# Patient Record
Sex: Male | Born: 1995 | Race: White | Hispanic: Yes | Marital: Single | Smoking: Never smoker
Health system: Southern US, Community
[De-identification: ages and names within clinical notes are randomized; demographics above are authoritative.]

## PROBLEM LIST (undated history)

## (undated) DIAGNOSIS — F909 Attention-deficit hyperactivity disorder, unspecified type: Secondary | ICD-10-CM

## (undated) DIAGNOSIS — F319 Bipolar disorder, unspecified: Secondary | ICD-10-CM

## (undated) HISTORY — PX: ANKLE SURGERY: SHX546

## (undated) HISTORY — DX: Bipolar disorder, unspecified: F31.9

## (undated) HISTORY — PX: HAND SURGERY: SHX662

## (undated) HISTORY — DX: Attention-deficit hyperactivity disorder, unspecified type: F90.9

---

## 2021-05-21 ENCOUNTER — Other Ambulatory Visit: Payer: Self-pay

## 2021-05-21 ENCOUNTER — Emergency Department (HOSPITAL_COMMUNITY): Payer: Self-pay

## 2021-05-21 ENCOUNTER — Encounter (HOSPITAL_COMMUNITY): Payer: Self-pay

## 2021-05-21 ENCOUNTER — Emergency Department (HOSPITAL_COMMUNITY)
Admission: EM | Admit: 2021-05-21 | Discharge: 2021-05-21 | Disposition: A | Discharge status: Home / Self Care | Payer: Self-pay | Attending: Emergency Medicine | Admitting: Emergency Medicine

## 2021-05-21 DIAGNOSIS — S21239A Puncture wound without foreign body of unspecified back wall of thorax without penetration into thoracic cavity, initial encounter: Secondary | ICD-10-CM

## 2021-05-21 DIAGNOSIS — S81831A Puncture wound without foreign body, right lower leg, initial encounter: Secondary | ICD-10-CM

## 2021-05-21 DIAGNOSIS — I959 Hypotension, unspecified: Secondary | ICD-10-CM | POA: Insufficient documentation

## 2021-05-21 DIAGNOSIS — Z20822 Contact with and (suspected) exposure to covid-19: Secondary | ICD-10-CM | POA: Insufficient documentation

## 2021-05-21 DIAGNOSIS — Z23 Encounter for immunization: Secondary | ICD-10-CM | POA: Insufficient documentation

## 2021-05-21 DIAGNOSIS — T1490XA Injury, unspecified, initial encounter: Secondary | ICD-10-CM

## 2021-05-21 DIAGNOSIS — S21209A Unspecified open wound of unspecified back wall of thorax without penetration into thoracic cavity, initial encounter: Secondary | ICD-10-CM | POA: Insufficient documentation

## 2021-05-21 DIAGNOSIS — S71132A Puncture wound without foreign body, left thigh, initial encounter: Secondary | ICD-10-CM

## 2021-05-21 DIAGNOSIS — W503XXA Accidental bite by another person, initial encounter: Secondary | ICD-10-CM

## 2021-05-21 LAB — COMPREHENSIVE METABOLIC PANEL
ALT: 31 U/L (ref 0–44)
AST: 33 U/L (ref 15–41)
Albumin: 3.2 g/dL — ABNORMAL LOW (ref 3.5–5.0)
Alkaline Phosphatase: 93 U/L (ref 38–126)
Anion gap: 12 (ref 5–15)
BUN: 10 mg/dL (ref 6–20)
CO2: 17 mmol/L — ABNORMAL LOW (ref 22–32)
Calcium: 8.3 mg/dL — ABNORMAL LOW (ref 8.9–10.3)
Chloride: 108 mmol/L (ref 98–111)
Creatinine, Ser: 1.3 mg/dL — ABNORMAL HIGH (ref 0.61–1.24)
GFR, Estimated: >60 mL/min (ref >60)
Glucose, Bld: 139 mg/dL — ABNORMAL HIGH (ref 70–99)
Potassium: 3.9 mmol/L (ref 3.5–5.1)
Sodium: 137 mmol/L (ref 135–145)
Total Bilirubin: 0.6 mg/dL (ref 0.3–1.2)
Total Protein: 6 g/dL — ABNORMAL LOW (ref 6.5–8.1)

## 2021-05-21 LAB — I-STAT CHEM 8, ED
BUN: 13 mg/dL (ref 6–20)
Calcium, Ion: 1.04 mmol/L — ABNORMAL LOW (ref 1.15–1.40)
Chloride: 108 mmol/L (ref 98–111)
Creatinine, Ser: 1.2 mg/dL (ref 0.61–1.24)
Glucose, Bld: 132 mg/dL — ABNORMAL HIGH (ref 70–99)
HCT: 42 % (ref 39.0–52.0)
Hemoglobin: 14.3 g/dL (ref 13.0–17.0)
Potassium: 4.3 mmol/L (ref 3.5–5.1)
Sodium: 140 mmol/L (ref 135–145)
TCO2: 18 mmol/L — ABNORMAL LOW (ref 22–32)

## 2021-05-21 LAB — RESP PANEL BY RT-PCR (FLU A&B, COVID) ARPGX2
Influenza A by PCR: NEGATIVE
Influenza B by PCR: NEGATIVE
SARS Coronavirus 2 by RT PCR: NEGATIVE

## 2021-05-21 LAB — CBC
HCT: 42.9 % (ref 39.0–52.0)
Hemoglobin: 14.1 g/dL (ref 13.0–17.0)
MCH: 29.6 pg (ref 26.0–34.0)
MCHC: 32.9 g/dL (ref 30.0–36.0)
MCV: 89.9 fL (ref 80.0–100.0)
Platelets: 335 10*3/uL (ref 150–400)
RBC: 4.77 MIL/uL (ref 4.22–5.81)
RDW: 13.6 % (ref 11.5–15.5)
WBC: 12.9 10*3/uL — ABNORMAL HIGH (ref 4.0–10.5)
nRBC: 0 % (ref 0.0–0.2)

## 2021-05-21 LAB — PROTIME-INR
INR: 1 (ref 0.8–1.2)
Prothrombin Time: 13.5 seconds (ref 11.4–15.2)

## 2021-05-21 LAB — TYPE AND SCREEN
ABO/RH(D): O NEG
Antibody Screen: NEGATIVE

## 2021-05-21 LAB — LACTIC ACID, PLASMA: Lactic Acid, Venous: 7.5 mmol/L (ref 0.5–1.9)

## 2021-05-21 LAB — ETHANOL: Alcohol, Ethyl (B): <10 mg/dL (ref <10)

## 2021-05-21 MED ORDER — OXYCODONE-ACETAMINOPHEN 5-325 MG PO TABS: 1.0000 | ORAL_TABLET | Freq: Three times a day (TID) | ORAL | 0 refills | Status: AC | PRN

## 2021-05-21 MED ORDER — AMOXICILLIN-POT CLAVULANATE 875-125 MG PO TABS: 1.0000 | ORAL_TABLET | Freq: Two times a day (BID) | ORAL | 0 refills | Status: AC

## 2021-05-21 MED ORDER — IOHEXOL 350 MG/ML SOLN
50.0000 mL | Freq: Once | INTRAVENOUS | Status: AC | PRN
  Administered 2021-05-21: 50 mL via INTRAVENOUS

## 2021-05-21 MED ORDER — FENTANYL CITRATE (PF) 100 MCG/2ML IJ SOLN
50.0000 ug | Freq: Once | INTRAMUSCULAR | Status: AC
  Administered 2021-05-21: 50 ug via INTRAVENOUS
  Filled 2021-05-21: qty 2

## 2021-05-21 MED ORDER — CEFAZOLIN SODIUM-DEXTROSE 2-4 GM/100ML-% IV SOLN
2.0000 g | Freq: Once | INTRAVENOUS | Status: AC
  Administered 2021-05-21: 2 g via INTRAVENOUS
  Filled 2021-05-21: qty 100

## 2021-05-21 MED ORDER — TETANUS-DIPHTH-ACELL PERTUSSIS 5-2.5-18.5 LF-MCG/0.5 IM SUSY
0.5000 mL | PREFILLED_SYRINGE | Freq: Once | INTRAMUSCULAR | Status: AC
  Administered 2021-05-21: 0.5 mL via INTRAMUSCULAR
  Filled 2021-05-21: qty 0.5

## 2021-05-21 MED ORDER — IOHEXOL 350 MG/ML SOLN
100.0000 mL | Freq: Once | INTRAVENOUS | Status: AC | PRN
  Administered 2021-05-21: 100 mL via INTRAVENOUS
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
## 2021-05-21 NOTE — Discharge Instructions (Addendum)
Follow-up with your primary care doctor or the trauma clinic if needed.
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
## 2021-05-21 NOTE — ED Notes (Signed)
Pt given two cokes and [place] sandwich.

## 2021-05-21 NOTE — ED Notes (Signed)
Pt refused COVID swab
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
## 2021-05-21 NOTE — Progress Notes (Signed)
Orthopedic Tech Progress Note Patient Details:  Albert Lopez Albert Lopez 06-Nov-1996 [id number]  Ortho Devices Type of Ortho Device: Crutches Ortho Device/Splint Interventions: Adjustment, Ordered      Albert Lopez 05/21/2021, 6:42 PM

## 2021-05-21 NOTE — ED Notes (Signed)
EMS gave pt NS 
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
